# Patient Record
Sex: Female | Born: 1970 | Hispanic: No | Marital: Single | Smoking: Never smoker
Health system: Southern US, Community
[De-identification: ages and names within clinical notes are randomized; demographics above are authoritative.]

---

## 2010-05-27 ENCOUNTER — Emergency Department (HOSPITAL_COMMUNITY)
Admission: EM | Admit: 2010-05-27 | Discharge: 2010-05-27 | Disposition: A | Discharge status: Other Acute Inpatient Hospital | Payer: Medicaid Other | Source: Home / Self Care | Attending: Emergency Medicine | Admitting: Emergency Medicine

## 2010-05-27 ENCOUNTER — Emergency Department (HOSPITAL_COMMUNITY): Payer: Medicaid Other

## 2010-05-27 ENCOUNTER — Inpatient Hospital Stay (HOSPITAL_COMMUNITY)
Admission: AD | Admit: 2010-05-27 | Discharge: 2010-06-01 | DRG: 749 | Disposition: A | Discharge status: Home / Self Care | Payer: Medicaid Other | Source: Ambulatory Visit | Attending: Obstetrics & Gynecology | Admitting: Obstetrics & Gynecology

## 2010-05-27 DIAGNOSIS — R5383 Other fatigue: Secondary | ICD-10-CM | POA: Insufficient documentation

## 2010-05-27 DIAGNOSIS — R5381 Other malaise: Secondary | ICD-10-CM | POA: Insufficient documentation

## 2010-05-27 DIAGNOSIS — R109 Unspecified abdominal pain: Secondary | ICD-10-CM | POA: Diagnosis present

## 2010-05-27 DIAGNOSIS — D649 Anemia, unspecified: Secondary | ICD-10-CM | POA: Diagnosis present

## 2010-05-27 DIAGNOSIS — R51 Headache: Secondary | ICD-10-CM | POA: Insufficient documentation

## 2010-05-27 DIAGNOSIS — R0609 Other forms of dyspnea: Secondary | ICD-10-CM | POA: Insufficient documentation

## 2010-05-27 DIAGNOSIS — D72829 Elevated white blood cell count, unspecified: Secondary | ICD-10-CM | POA: Insufficient documentation

## 2010-05-27 DIAGNOSIS — N1330 Unspecified hydronephrosis: Secondary | ICD-10-CM | POA: Diagnosis present

## 2010-05-27 DIAGNOSIS — R509 Fever, unspecified: Secondary | ICD-10-CM | POA: Insufficient documentation

## 2010-05-27 DIAGNOSIS — N731 Chronic parametritis and pelvic cellulitis: Principal | ICD-10-CM | POA: Diagnosis present

## 2010-05-27 DIAGNOSIS — L0291 Cutaneous abscess, unspecified: Secondary | ICD-10-CM

## 2010-05-27 DIAGNOSIS — R112 Nausea with vomiting, unspecified: Secondary | ICD-10-CM | POA: Insufficient documentation

## 2010-05-27 DIAGNOSIS — R319 Hematuria, unspecified: Secondary | ICD-10-CM | POA: Insufficient documentation

## 2010-05-27 DIAGNOSIS — IMO0002 Reserved for concepts with insufficient information to code with codable children: Secondary | ICD-10-CM

## 2010-05-27 DIAGNOSIS — R0989 Other specified symptoms and signs involving the circulatory and respiratory systems: Secondary | ICD-10-CM | POA: Insufficient documentation

## 2010-05-27 DIAGNOSIS — N7093 Salpingitis and oophoritis, unspecified: Secondary | ICD-10-CM | POA: Insufficient documentation

## 2010-05-27 DIAGNOSIS — R42 Dizziness and giddiness: Secondary | ICD-10-CM | POA: Insufficient documentation

## 2010-05-27 LAB — URINALYSIS, ROUTINE W REFLEX MICROSCOPIC
Bilirubin Urine: NEGATIVE
Glucose, UA: NEGATIVE mg/dL
Ketones, ur: NEGATIVE mg/dL
Protein, ur: 100 mg/dL — AB
pH: 6 (ref 5.0–8.0)

## 2010-05-27 LAB — COMPREHENSIVE METABOLIC PANEL
ALT: 71 U/L — ABNORMAL HIGH (ref 0–35)
AST: 65 U/L — ABNORMAL HIGH (ref 0–37)
CO2: 26 mEq/L (ref 19–32)
Chloride: 96 mEq/L (ref 96–112)
GFR calc Af Amer: >60 mL/min (ref >60)
GFR calc non Af Amer: >60 mL/min (ref >60)
Glucose, Bld: 78 mg/dL (ref 70–99)
Sodium: 132 mEq/L — ABNORMAL LOW (ref 135–145)
Total Bilirubin: 1.5 mg/dL — ABNORMAL HIGH (ref 0.3–1.2)

## 2010-05-27 LAB — URINE MICROSCOPIC-ADD ON

## 2010-05-27 LAB — DIFFERENTIAL
Basophils Relative: 1 % (ref 0–1)
Eosinophils Absolute: 0 10*3/uL (ref 0.0–0.7)
Eosinophils Relative: 0 % (ref 0–5)
Monocytes Absolute: 3.1 10*3/uL — ABNORMAL HIGH (ref 0.1–1.0)
Neutro Abs: 28.7 10*3/uL — ABNORMAL HIGH (ref 1.7–7.7)
Neutrophils Relative %: 84 % — ABNORMAL HIGH (ref 43–77)

## 2010-05-27 LAB — WET PREP, GENITAL
Clue Cells Wet Prep HPF POC: NONE SEEN
Trich, Wet Prep: NONE SEEN

## 2010-05-27 LAB — CBC
HCT: 32.2 % — ABNORMAL LOW (ref 36.0–46.0)
MCH: 28.6 pg (ref 26.0–34.0)
MCV: 79.3 fL (ref 78.0–100.0)
RBC: 4.06 MIL/uL (ref 3.87–5.11)
RDW: 14.9 % (ref 11.5–15.5)
WBC: 34.1 10*3/uL — ABNORMAL HIGH (ref 4.0–10.5)

## 2010-05-27 LAB — LIPASE, BLOOD: Lipase: 103 U/L — ABNORMAL HIGH (ref 11–59)

## 2010-05-27 MED ORDER — IOHEXOL 300 MG/ML  SOLN
100.0000 mL | Freq: Once | INTRAMUSCULAR | Status: AC | PRN
  Administered 2010-05-27: 100 mL via INTRAVENOUS

## 2010-05-28 DIAGNOSIS — R5383 Other fatigue: Secondary | ICD-10-CM

## 2010-05-28 DIAGNOSIS — D72829 Elevated white blood cell count, unspecified: Secondary | ICD-10-CM

## 2010-05-28 DIAGNOSIS — R509 Fever, unspecified: Secondary | ICD-10-CM

## 2010-05-28 DIAGNOSIS — N7093 Salpingitis and oophoritis, unspecified: Secondary | ICD-10-CM

## 2010-05-28 DIAGNOSIS — R5381 Other malaise: Secondary | ICD-10-CM

## 2010-05-28 LAB — DIFFERENTIAL
Basophils Absolute: 0.2 10*3/uL — ABNORMAL HIGH (ref 0.0–0.1)
Basophils Absolute: 0.3 10*3/uL — ABNORMAL HIGH (ref 0.0–0.1)
Basophils Relative: 1 % (ref 0–1)
Eosinophils Absolute: 0.1 10*3/uL (ref 0.0–0.7)
Eosinophils Relative: 0 % (ref 0–5)
Lymphocytes Relative: 5 % — ABNORMAL LOW (ref 12–46)
Lymphs Abs: 1.6 10*3/uL (ref 0.7–4.0)
Monocytes Absolute: 3.3 10*3/uL — ABNORMAL HIGH (ref 0.1–1.0)
Monocytes Relative: 9 % (ref 3–12)
WBC Morphology: INCREASED

## 2010-05-28 LAB — COMPREHENSIVE METABOLIC PANEL
ALT: 60 U/L — ABNORMAL HIGH (ref 0–35)
BUN: 8 mg/dL (ref 6–23)
CO2: 22 mEq/L (ref 19–32)
Calcium: 7.7 mg/dL — ABNORMAL LOW (ref 8.4–10.5)
Calcium: 7.8 mg/dL — ABNORMAL LOW (ref 8.4–10.5)
Creatinine, Ser: 0.93 mg/dL (ref 0.4–1.2)
GFR calc non Af Amer: >60 mL/min (ref >60)
Glucose, Bld: 67 mg/dL — ABNORMAL LOW (ref 70–99)
Glucose, Bld: 72 mg/dL (ref 70–99)
Sodium: 135 mEq/L (ref 135–145)
Total Protein: 5.9 g/dL — ABNORMAL LOW (ref 6.0–8.3)

## 2010-05-28 LAB — CBC
HCT: 26.4 % — ABNORMAL LOW (ref 36.0–46.0)
Hemoglobin: 9.7 g/dL — ABNORMAL LOW (ref 12.0–15.0)
Hemoglobin: 9.2 g/dL — ABNORMAL LOW (ref 12.0–15.0)
MCH: 28.4 pg (ref 26.0–34.0)
MCHC: 35.8 g/dL (ref 30.0–36.0)
Platelets: 378 10*3/uL (ref 150–400)
RDW: 15 % (ref 11.5–15.5)
RDW: 15 % (ref 11.5–15.5)
WBC: 32.7 10*3/uL — ABNORMAL HIGH (ref 4.0–10.5)

## 2010-05-28 LAB — HEPATITIS PANEL, ACUTE
Hep B C IgM: NEGATIVE
Hepatitis B Surface Ag: NEGATIVE

## 2010-05-28 LAB — RAPID HIV SCREEN (WH-MAU): Rapid HIV Screen: NONREACTIVE

## 2010-05-28 LAB — ABO/RH: ABO/RH(D): A POS

## 2010-05-29 LAB — COMPREHENSIVE METABOLIC PANEL
ALT: 50 U/L — ABNORMAL HIGH (ref 0–35)
AST: 36 U/L (ref 0–37)
Albumin: 1.7 g/dL — ABNORMAL LOW (ref 3.5–5.2)
Alkaline Phosphatase: 93 U/L (ref 39–117)
BUN: 8 mg/dL (ref 6–23)
Chloride: 110 mEq/L (ref 96–112)
Potassium: 3.5 mEq/L (ref 3.5–5.1)
Sodium: 134 mEq/L — ABNORMAL LOW (ref 135–145)
Total Bilirubin: 1.4 mg/dL — ABNORMAL HIGH (ref 0.3–1.2)

## 2010-05-29 LAB — URINE CULTURE: Special Requests: POSITIVE

## 2010-05-29 LAB — CBC
MCV: 81.1 fL (ref 78.0–100.0)
Platelets: 352 10*3/uL (ref 150–400)
RBC: 2.96 MIL/uL — ABNORMAL LOW (ref 3.87–5.11)
WBC: 24.4 10*3/uL — ABNORMAL HIGH (ref 4.0–10.5)

## 2010-05-30 ENCOUNTER — Ambulatory Visit (HOSPITAL_COMMUNITY): Payer: Medicaid Other | Attending: Obstetrics & Gynecology

## 2010-05-30 ENCOUNTER — Ambulatory Visit (HOSPITAL_COMMUNITY): Payer: Self-pay

## 2010-05-30 DIAGNOSIS — N731 Chronic parametritis and pelvic cellulitis: Secondary | ICD-10-CM | POA: Insufficient documentation

## 2010-05-30 LAB — CBC
HCT: 23.7 % — ABNORMAL LOW (ref 36.0–46.0)
Hemoglobin: 8.1 g/dL — ABNORMAL LOW (ref 12.0–15.0)
MCHC: 34.2 g/dL (ref 30.0–36.0)
RBC: 2.93 MIL/uL — ABNORMAL LOW (ref 3.87–5.11)
WBC: 21.5 10*3/uL — ABNORMAL HIGH (ref 4.0–10.5)

## 2010-05-30 LAB — COMPREHENSIVE METABOLIC PANEL
ALT: 39 U/L — ABNORMAL HIGH (ref 0–35)
AST: 25 U/L (ref 0–37)
Alkaline Phosphatase: 106 U/L (ref 39–117)
CO2: 20 mEq/L (ref 19–32)
Calcium: 7.8 mg/dL — ABNORMAL LOW (ref 8.4–10.5)
Chloride: 115 mEq/L — ABNORMAL HIGH (ref 96–112)
GFR calc Af Amer: >60 mL/min (ref >60)
GFR calc non Af Amer: >60 mL/min (ref >60)
Glucose, Bld: 62 mg/dL — ABNORMAL LOW (ref 70–99)
Potassium: 4.2 mEq/L (ref 3.5–5.1)
Sodium: 139 mEq/L (ref 135–145)
Total Bilirubin: 0.8 mg/dL (ref 0.3–1.2)

## 2010-05-30 LAB — AMYLASE: Amylase: 116 U/L — ABNORMAL HIGH (ref 0–105)

## 2010-05-31 ENCOUNTER — Inpatient Hospital Stay (HOSPITAL_COMMUNITY): Payer: Medicaid Other

## 2010-05-31 LAB — CBC
MCH: 27.7 pg (ref 26.0–34.0)
MCHC: 34.6 g/dL (ref 30.0–36.0)
MCV: 80.1 fL (ref 78.0–100.0)
Platelets: 471 10*3/uL — ABNORMAL HIGH (ref 150–400)
RBC: 2.67 MIL/uL — ABNORMAL LOW (ref 3.87–5.11)

## 2010-05-31 LAB — GC/CHLAMYDIA PROBE AMP, GENITAL
Chlamydia, DNA Probe: NEGATIVE
GC Probe Amp, Genital: NEGATIVE

## 2010-06-01 LAB — COMPREHENSIVE METABOLIC PANEL
ALT: 25 U/L (ref 0–35)
AST: 16 U/L (ref 0–37)
CO2: 24 mEq/L (ref 19–32)
Chloride: 110 mEq/L (ref 96–112)
GFR calc Af Amer: >60 mL/min (ref >60)
GFR calc non Af Amer: >60 mL/min (ref >60)
Glucose, Bld: 75 mg/dL (ref 70–99)
Sodium: 139 mEq/L (ref 135–145)
Total Bilirubin: 0.8 mg/dL (ref 0.3–1.2)

## 2010-06-01 LAB — TYPE AND SCREEN
ABO/RH(D): A POS
Unit division: 0

## 2010-06-01 LAB — CBC
MCHC: 36.3 g/dL — ABNORMAL HIGH (ref 30.0–36.0)
Platelets: 452 10*3/uL — ABNORMAL HIGH (ref 150–400)
RDW: 15.1 % (ref 11.5–15.5)
WBC: 17.2 10*3/uL — ABNORMAL HIGH (ref 4.0–10.5)

## 2010-06-03 LAB — CULTURE, BLOOD (ROUTINE X 2)
Culture  Setup Time: 201203181159
Culture: NO GROWTH

## 2010-06-03 LAB — CULTURE, ROUTINE-ABSCESS

## 2010-06-04 LAB — ANAEROBIC CULTURE

## 2010-06-08 ENCOUNTER — Ambulatory Visit (HOSPITAL_COMMUNITY)
Admit: 2010-06-08 | Discharge: 2010-06-08 | Disposition: A | Discharge status: Home / Self Care | Payer: Medicaid Other | Attending: Obstetrics and Gynecology | Admitting: Obstetrics and Gynecology

## 2010-06-08 ENCOUNTER — Ambulatory Visit (HOSPITAL_COMMUNITY)
Admission: RE | Admit: 2010-06-08 | Discharge: 2010-06-08 | Disposition: A | Discharge status: Home / Self Care | Payer: Medicaid Other | Source: Ambulatory Visit | Attending: Obstetrics and Gynecology | Admitting: Obstetrics and Gynecology

## 2010-06-08 ENCOUNTER — Other Ambulatory Visit: Payer: Self-pay | Admitting: Obstetrics and Gynecology

## 2010-06-08 DIAGNOSIS — N949 Unspecified condition associated with female genital organs and menstrual cycle: Secondary | ICD-10-CM | POA: Insufficient documentation

## 2010-06-08 DIAGNOSIS — K37 Unspecified appendicitis: Secondary | ICD-10-CM | POA: Insufficient documentation

## 2010-06-08 DIAGNOSIS — IMO0002 Reserved for concepts with insufficient information to code with codable children: Secondary | ICD-10-CM

## 2010-06-08 DIAGNOSIS — R599 Enlarged lymph nodes, unspecified: Secondary | ICD-10-CM | POA: Insufficient documentation

## 2010-06-08 MED ORDER — IOHEXOL 300 MG/ML  SOLN
100.0000 mL | Freq: Once | INTRAMUSCULAR | Status: AC | PRN
  Administered 2010-06-08: 100 mL via INTRAVENOUS

## 2010-06-09 ENCOUNTER — Ambulatory Visit: Payer: Self-pay | Admitting: Obstetrics & Gynecology

## 2010-06-09 DIAGNOSIS — K37 Unspecified appendicitis: Secondary | ICD-10-CM

## 2010-06-10 NOTE — Progress Notes (Signed)
[name], [name]        ACCOUNT NO.:  [id number]  MEDICAL RECORD NO.:  [id number]           PATIENT TYPE:  A  LOCATION:  [facility]                   FACILITY:  [facility]  PHYSICIAN:  Scheryl Darter, MD       DATE OF BIRTH:  04-21-1970  DATE OF SERVICE:                                 CLINIC NOTE  The patient comes here in followup after being hospitalized from March 17 to March 22 with a pelvic abscess.  A drain was placed by Interventional Radiology during hospitalization and she has been discharge since the 22nd.  She has been receiving IV.  Pincus Sanes which was to continue for 14 days after discharge.  She says she still has a decreased appetite and some pain, but no fever.  CT scan was performed at [facility] yesterday and the report states that there is a resolution of large a pelvic abscess, but appendicitis was now visualized which was consistently with the source of the prior pelvic abscess.  The drain was removed.  The drainage site appears well healing.  Her abdomen is mildly distended and minimally tender.  No guarding or rebound.  I spoke to the PA for [facility] at [facility].  Arrangements will be made for her to be seen by Dr. Freida Busman early next week with plans to schedule an antral appendectomy.  She will continue with antibiotics.  Contact information for the patient was exchanged.  If she has increasing symptoms, she should report was [facility].     Scheryl Darter, MD    JA/MEDQ  D:  06/09/2010  T:  06/10/2010  Job:  [id number]

## 2010-06-14 ENCOUNTER — Ambulatory Visit: Payer: Medicaid Other

## 2010-06-19 ENCOUNTER — Inpatient Hospital Stay (HOSPITAL_COMMUNITY)
Admission: AD | Admit: 2010-06-19 | Discharge: 2010-06-19 | Disposition: A | Discharge status: Home / Self Care | Payer: Medicaid Other | Source: Ambulatory Visit | Attending: Family Medicine | Admitting: Family Medicine

## 2010-06-19 ENCOUNTER — Emergency Department (HOSPITAL_COMMUNITY)
Admission: EM | Admit: 2010-06-19 | Discharge: 2010-06-19 | Disposition: A | Discharge status: Home / Self Care | Payer: Medicaid Other | Attending: Emergency Medicine | Admitting: Emergency Medicine

## 2010-06-19 DIAGNOSIS — Z139 Encounter for screening, unspecified: Secondary | ICD-10-CM | POA: Insufficient documentation

## 2010-06-19 DIAGNOSIS — Z452 Encounter for adjustment and management of vascular access device: Secondary | ICD-10-CM | POA: Insufficient documentation

## 2010-06-19 DIAGNOSIS — Z8613 Personal history of malaria: Secondary | ICD-10-CM | POA: Insufficient documentation

## 2010-06-19 DIAGNOSIS — N731 Chronic parametritis and pelvic cellulitis: Secondary | ICD-10-CM | POA: Insufficient documentation

## 2010-06-20 NOTE — Discharge Summary (Signed)
  [name], [name]        ACCOUNT NO.:  [id number]  MEDICAL RECORD NO.:  [id number]           PATIENT TYPE:  I  LOCATION:  9307                          [facility]  [facility]  PHYSICIAN:  Catalina Antigua, MD     DATE OF BIRTH:  [birth year]  DATE OF ADMISSION:  05/27/2010 DATE OF DISCHARGE:  06/01/2010                              DISCHARGE SUMMARY   ADMISSION DIAGNOSIS:  Abdominal pain.  DISCHARGE DIAGNOSIS:  Pelvic abscess.  BRIEF HISTORY:  This is a 40 year old G1, P1 who presented to [facility] Emergency Room with a 5-day history of diffuse abdominal pain and diarrhea.  On admission, she had a significant leukocytosis of 34,000, elevated AST and ALT of 65 and 71 as well as elevated amylase of 163 and lipase of 103.  Physical exam demonstrated abdominal distention.  CAT scan was performed and demonstrated 2 large pelvic abscesses suspicious for tubo-ovarian abscess.  The patient was transferred to [facility] and was started on a course of IV antibiotics.  The patient remained afebrile throughout her stay.  General Surgery was consulted and helped coordinate for higher drainage of the pelvic abscess.  On hospital day #4, the patient was transferred to [facility] for drainage of the pelvic abscess where approximately 420 mL of purulent discharge was removed and Jackson-Pratt suction bulb remained in place for continuous drainage.  The patient tolerated the procedure well.  Her white count over the course of admission continued to decrease.  She was found to be anemic with a hemoglobin of 7.4 on hospital day #5, which was thought to be dilutional effect, but she received 3 units of packed red blood cells.  On day of discharge, her white count was found to be 17,000, hemoglobin of 12.5.  Her amylase and lipase were within the normal range as well as her LFTs.  The patient remained afebrile throughout her course of admission and her vital signs remained otherwise stable.   On hospital day #6, the patient was seem to be stable for discharge home.  She also had a PICC line placed on hospital day #5 and the patient was discharged home with plans for home health.  Administration of Invanz for the next 14 days.  Home Health is also to help manage the dressing and flush the JP drain.  The patient has a followup appointment scheduled at [facility] on June 08, 2010, at 10 a.m. for repeat CAT scan of the abdomen and possible drain removal if the abscess has significantly decreased in size.  She also has a follow up GYN appointment scheduled on June 09, 2010, at 11 a.m.  The patient was instructed to return to the emergency room if she develops fevers, severe abdominal pain, or signs of infection surrounding the drain.  The patient is to take Tylenol or Motrin p.r.n. for pain.  The patient verbalized understanding of these instructions, which were provided by me in [place].  All questions were answered.    Catalina Antigua, MD    PC/MEDQ  D:  06/01/2010  T:  06/02/2010  Job:  [id number]  Electronically Signed by Catalina Antigua  on 06/20/2010 10:38:51 AM

## 2010-06-21 NOTE — Consult Note (Signed)
[name], [name]        ACCOUNT NO.:  [id number]  MEDICAL RECORD NO.:  [id number]           PATIENT TYPE:  I  LOCATION:  9307                          [facility]  PHYSICIAN:  Lennie Muckle, MD      DATE OF BIRTH:  04-20-1970  DATE OF CONSULTATION:  05/29/2010 DATE OF DISCHARGE:                                CONSULTATION   REFERRING PHYSICIAN:  Dr. Jolayne Panther, Teaching Service.  REASON FOR CONSULTATION:  Abdominal pain with large abdominal abscess.  BRIEF HISTORY:  The patient is a 40 year old [place] American female, recent immigrant from [place] in [place] who was admitted on May 27, 2010, with a 3- to 5-days history of abdominal pain prior to admission.  The patient noticed the pain comes and goes, it covers her abdomen, most significantly in  the right lower and left lower abdomen extending to her back.  It comes and goes.  She does not have any nausea or vomiting.  She has had some fever.  She also developed constipation about 7 days ago with the onset of this problem.  She reports no problems prior to this.  She has recently arrived in the [place] on February 18 and says she was in perfectly good health prior.   PAST MEDICAL HISTORY:  H and P from the hospitalist service suggests she has had malaria but she reports currently that she has never had malaria.  Of note, the patient speaks only [place] and thus her interview was done over the phone using interpreter service.  PAST SURGICAL HISTORY:  She had a full-term pregnancy with a C-section.  FAMILY HISTORY:  I can only ascertain that both her parents are deceased.  Her siblings are in good health.  SOCIAL HISTORY:  She denies alcohol, tobacco or drug use.  She is not married.  She currently lives with her sister.  She has been to country since February [place].  REVIEW OF SYSTEMS:  Last menstrual period was April 08, 2010, her weight is down but she is not sure how much.  She has dizziness, sometimes  with the abdominal pain.  She is unaware of any skin changes or rashes.  PULMONARY:  She denies any problems with shortness of breath, I am not certain, but I do not think she has not had any problems with upper respiratory tract infections, asthma or wheezing. CARDIAC:  She has chest discomfort only when her belly hurts.  GI: Negative for nausea, vomiting, diarrhea.  She has had some constipation. No bowel movement apparently for the last few days.  She is unaware of any blood in her stool.  GU:  She has had trouble voiding and pain with voiding since the onset of her abdominal pain.  MUSCULOSKELETAL:  No changes.  She does note the abdominal pain is similar to what she have when she have dysentery.  PSYCH:  No changes.  MEDICATIONS ON ADMISSION:  None.  Current hospital meds include Colace 1 b.i.d., doxycycline 100 mg q.12, Zosyn 3.375 g q.8.  She is on a prenatal vitamin.  She had a PPD placed.  She has Motrin, Tylenol and Percocet p.r.n. for pain.  ALLERGIES:  None.  PHYSICAL EXAMINATION:  GENERAL:  This is a thin African female in no acute distress. VITAL SIGNS:  Her T-max yesterday was 100.3, currently she is 98.7, blood pressure is 121/86, last heart rate was 96, temperature 97.8.  She is 100% on room air.  She weighs 54.4 kg. HEENT:  Head, normocephalic.  Eyes, ears, nose and throat are all within normal limits.  Dentition is in good repair. NECK:  No bruits.  No JVD.  No thyromegaly.  Trachea is in the midline. PULMONARY:  Chest is nontender.  Clear to auscultation. CARDIAC:  Normal S1 and S2, slightly tachycardic.  Pulses are +2 in the upper extremities, somewhat diminished in the lower extremities. ABDOMEN:  Bowel sounds are present.  Abdomen is distended.  She is diffusely tender.  It is mostly in the right and left lower quadrants. LYMPHS:  No lymphadenopathy was palpated. MUSCULOSKELETAL:  Normal joints.  Normal range of motion and strength. SKIN:  No changes  noted. NEUROLOGIC:  Cranial nerves were grossly intact. PSYCH:  Normal affect.  LABORATORY FINDINGS:  On admission, urine pregnancy was negative.  UA was positive for blood.  There were 3-6 white cells and 11-20 red cells. Wet prep was negative.  HIV was negative.  Amylase was 163.  Lipase was 103.  Protime was 14.8.  INR was 1.14.  Sodium is 132, potassium is 2.7, chloride is 96, CO2 is 26, BUN is 10, creatinine is 0.74, glucose 78, total bilirubin elevated at 1.5, alk phos 137, SGOT and SGPT slightly elevated at 65 and 71 respectively.  White count was 34,000.  Currently white count is 24.4, hemoglobin is 8, hematocrit is 24, platelets are 352,000, sodium is 134, potassium is 3.5, chloride is 110, CO2 is 20, BUN is 8, creatinine is 0.98, total bilirubin is 1.4, alk phos 93, SGPT is down to 36 normal, SGOT is slightly elevated down to 50.  CT of the abdomen showed a multiloculated fluid collection in lower abdomen and pelvis compatible with an abscess, largest collection is in the cul-de- sac between the uterus and rectum, it measures 11.6 cm.  This most likely represents a large tuboovarian abscess but they could not rule out involving the appendix.  There is some collection extends into the retroperitoneum along the ileocolic ligament.  There is an enlarged pelvic lymph nodes which are likely reactive and there is mild bilateral hydronephrosis secondary to obstruction of the ureters by abscess collection.  IMPRESSION: 1. Multiloculated fluid collection in cul-de-sac between uterus and     rectum of unknown etiology. 2. Possible tuboovarian abscess versus lower gastrointestinal source     for abscess. 3. Hydronephrosis. 4. Anemia. 5. Leukocytosis.  PLAN:  Discussed  with Dr. Freida Busman.   After reviewing patient and study it was her opinion that the collection was not related to a lower GI source.  We will ask IR to reevaluate for possible drainage with further workup and evaluation  as needed.    Eber Hong, P.A.   ______________________________ Lennie Muckle, MD   WDJ/MEDQ  D:  05/29/2010  T:  05/29/2010  Job:  [id number]  cc:   Dr. Jolayne Panther  Electronically Signed by Sherrie George P.A. on 06/10/2010 10:16:44 AM Electronically Signed by Bertram Savin MD on 06/21/2010 12:08:06 PM

## 2010-06-26 ENCOUNTER — Other Ambulatory Visit: Payer: Self-pay | Admitting: General Surgery

## 2010-06-26 DIAGNOSIS — IMO0002 Reserved for concepts with insufficient information to code with codable children: Secondary | ICD-10-CM

## 2010-07-07 NOTE — H&P (Signed)
[name], [name]        ACCOUNT NO.:  [id number]  MEDICAL RECORD NO.:  [id number]           PATIENT TYPE:  I  LOCATION:  9303                          [facility]  PHYSICIAN:  Nandana Krolikowski H. Tenny Craw, MD     DATE OF BIRTH:  [birth year]  DATE OF ADMISSION:  05/27/2010 DATE OF DISCHARGE:                             HISTORY & PHYSICAL   CHIEF COMPLAINT:  Abdominal pain.  HISTORY OF PRESENT ILLNESS:  Ms. [name] is a 40 year old gravida 1, para 1 African female who was in her usual state of health until approximately 3-5 days prior to admission when she began to develop vague abdominal pain and disease.  The patient is recent immigrant to the [place] from the [place].  She speaks only [place].  The interview with the patient was conducted with the help of the interpreter line.  The patient states that over the last several days, she has had worsening abdominal and back pain.  She presented to [facility] ER.  White count was obtained and she had a significant leukocytosis of 34,000.  She also had abdominal distention and a CT scan was performed which demonstrated 2 large abscess is in the pelvis one in the front of the uterus and one in the back of the uterus.  The ovaries were not well visualized and the appendix was not well visualized; however, the radiologist called the abscess is consistent with tubo- ovarian abscesses, and the patient was requested to be transferred to [facility] for management of TOA by the emergency room physician.  The patient denies any fevers, chills, recent nausea, or vomiting.  She does endorse about a 5-pound weight loss recently.  She denies diarrhea or constipation.  She denies night sweats.  She denies any sick contacts.  PAST MEDICAL HISTORY:  Significant for malaria.  PAST SURGICAL HISTORY:  Significant for a full-term primary cesarean section for a female infant that weighed 10 pounds 12 ounces.  This was in 2008 in her  native [place].  She denies any gestational diabetes and does endorse receiving routine prenatal care.  She denies any other surgical procedures.  She is not currently sexually active and has not had intercourse since immigrating to the [place].  SOCIAL HISTORY:  Negative x3.  PHYSICAL EXAMINATION:  VITAL SIGNS:  Blood pressure is 122/90, heart rate 104, temperature is 99.6.  Respirations are 20. GENERAL:  She is alert and oriented x3.  She is slightly ill appearing. She does seem somewhat dehydrated appearing and almost has a look of temporal wasting. ABDOMEN:  Soft but distended appearing tender but no rebound or guarding.  Per the ER exam, she did have cervical motion tenderness on exam.  LABORATORY DATA:  CBC:  White blood cell count 4.1, hemoglobin 11.6, hematocrit 32.2, platelets clumps are noted on the smear.  However, the count appears adequate, 84% neutrophils.  The white blood cell morphology is greater than 20% bands with mild left shift, target cells are also noted.  CMET:  Sodium is 132, potassium is 2.7, chloride is 96, CO2 is 26, glucose 78, BUN is 10, creatinine 0.74, AST is 65, ALT is 71.  Lipase is 103, INR 1.14.  PT 14.8.  Wet prep, many white blood cells are seen.  Per the ER physician, gonorrhea and Chlamydia were collected and pending.  Urinalysis showed large blood, 1+ protein, and small amount of leukocyte esterase, negative nitrites, micro showed 3 to 6 white blood cells, 11-20 red blood cells, rare bacteria.  Pregnancy test is negative.  ASSESSMENT AND PLAN:  This is a 40 year old African female with pelvic abscess and leukocytosis.  The patient's care is being transferred to the teaching service.  Dr. Nicholaus Bloom accepting the patient and transfer. Leukocytosis:  The patient did receive cefoxitin and doxy in the ER.  We will change the cefoxitin to Zosyn and continue the doxycycline, repeat a CBC with diff, and a CMET.  We will obtain blood culture, urine cultures,  rapid HIV, RPR, hepatitis panel.  We will place a PPD for tuberculosis evaluation.     [name]. Tenny Craw, MD     KHR/MEDQ  D:  05/28/2010  T:  05/28/2010  Job:  [id number]  Electronically Signed by Waynard Reeds MD on 07/07/2010 10:45:54 AM

## 2010-07-11 ENCOUNTER — Ambulatory Visit
Admission: RE | Admit: 2010-07-11 | Discharge: 2010-07-11 | Disposition: A | Discharge status: Home / Self Care | Payer: Medicaid Other | Source: Ambulatory Visit | Attending: General Surgery | Admitting: General Surgery

## 2010-07-11 DIAGNOSIS — IMO0002 Reserved for concepts with insufficient information to code with codable children: Secondary | ICD-10-CM

## 2010-07-11 MED ORDER — IOHEXOL 300 MG/ML  SOLN
100.0000 mL | Freq: Once | INTRAMUSCULAR | Status: AC | PRN
  Administered 2010-07-11: 100 mL via INTRAVENOUS

## 2010-07-20 ENCOUNTER — Other Ambulatory Visit: Payer: Self-pay | Admitting: General Surgery

## 2010-07-20 ENCOUNTER — Encounter (HOSPITAL_COMMUNITY): Payer: Medicaid Other

## 2010-07-20 ENCOUNTER — Other Ambulatory Visit: Payer: Self-pay | Admitting: Anesthesiology

## 2010-07-20 LAB — DIFFERENTIAL
Basophils Absolute: 0.1 10*3/uL (ref 0.0–0.1)
Basophils Relative: 1 % (ref 0–1)
Eosinophils Absolute: 0.3 10*3/uL (ref 0.0–0.7)
Neutrophils Relative %: 45 % (ref 43–77)

## 2010-07-20 LAB — SURGICAL PCR SCREEN
MRSA, PCR: NEGATIVE
Staphylococcus aureus: POSITIVE — AB

## 2010-07-20 LAB — CBC
MCH: 28.1 pg (ref 26.0–34.0)
Platelets: 257 10*3/uL (ref 150–400)
RBC: 5.05 MIL/uL (ref 3.87–5.11)
WBC: 7 10*3/uL (ref 4.0–10.5)

## 2010-07-20 LAB — HCG, SERUM, QUALITATIVE: Preg, Serum: NEGATIVE

## 2010-07-25 ENCOUNTER — Ambulatory Visit (HOSPITAL_COMMUNITY)
Admission: RE | Admit: 2010-07-25 | Discharge: 2010-07-25 | Disposition: A | Discharge status: Home / Self Care | Payer: Medicaid Other | Source: Ambulatory Visit | Attending: General Surgery | Admitting: General Surgery

## 2010-07-25 ENCOUNTER — Other Ambulatory Visit (INDEPENDENT_AMBULATORY_CARE_PROVIDER_SITE_OTHER): Payer: Self-pay | Admitting: General Surgery

## 2010-07-25 DIAGNOSIS — K35209 Acute appendicitis with generalized peritonitis, without abscess, unspecified as to perforation: Secondary | ICD-10-CM | POA: Insufficient documentation

## 2010-07-25 DIAGNOSIS — Z01812 Encounter for preprocedural laboratory examination: Secondary | ICD-10-CM | POA: Insufficient documentation

## 2010-07-25 DIAGNOSIS — K352 Acute appendicitis with generalized peritonitis, without abscess: Secondary | ICD-10-CM | POA: Insufficient documentation

## 2010-08-28 NOTE — Op Note (Signed)
NAMEPORSHA, SKILTON        ACCOUNT NO.:  [id number]  MEDICAL RECORD NO.:  [id number]           PATIENT TYPE:  O  LOCATION:  DAYL                         FACILITY:  [facility]  PHYSICIAN:  Lennie Muckle, MD      DATE OF BIRTH:  [birth year]  DATE OF PROCEDURE:  07/25/2010 DATE OF DISCHARGE:                              OPERATIVE REPORT   PREOPERATIVE DIAGNOSIS:  History of perforated appendicitis.  POSTOPERATIVE DIAGNOSIS:  History of perforated appendicitis.  PROCEDURE:  Laparoscopic appendectomy.  SURGEON:  Lennie Muckle, MD  ANESTHESIA:  General endotracheal anesthesia.  FINDINGS:  Appendiceal stump, no acute purulent fluid.  SPECIMEN:  Was appendix.  ESTIMATED BLOOD LOSS:  Minimal amount of blood loss.  INDICATIONS FOR PROCEDURE:  Ms. Schillo is a 40 year old female who came in March and had a large pelvic abscess, it was believed to be a tubo-ovarian abscess; however, on followup imaging it seemed more consistent from the appendix.  She was treated with a drain and treated as a perforated appendicitis.  She was on antibiotics for approximately 2 weeks' time.  She had her drain removed without incident and I had seen her back in the office and discussed laparoscopic interval appendectomy.  Risks of surgery and benefits were explained to the patient.  This is via an interpreter and her husband.  She agreed to go ahead and proceed with laparoscopic appendectomy.  DETAILS OF PROCEDURE:  Ms. Sutter was identified in the preoperative holding area, given IV antibiotics, seen by Anesthesia and taken to the operating suite.  Once in the operating room, she was placed in supine position.  Sequential compression devices were applied to her lower extremities.  She was placed under general endotracheal anesthesia.  Her abdomen was prepped and draped in usual sterile fashion.  Surgical time- out performed.  I began by placing a 5-mm trocar in the right upper quadrant  under visualization of camera.  Using Optiview, all layers of abdominal wall were visualized upon entry.  I insufflated the abdomen and found no evidence of injury upon placement of trocar.  I placed a 5- mm trocar at the umbilical region under visualization with camera.  The cecum was identified in the right side of the abdomen.  I upsized the right upper quadrant trocar to a 12-mm trocar and placed a 5-mm trocar in the left side of the abdomen under visualization with camera.  Using blunt graspers, I grasped the cecum and pulled this laterally.  The patient was placed in the right side up Trendelenburg position.  There seem to be a small appendiceal stump which I continued dissecting mostly using blunt dissection.  I mobilized the cecum along the line of Toldt. Once I had mobilized enough of the cecum, I continued dissecting towards the base of the appendix.  We came across the base of the appendix with the Harmonic scalpel.  I was able to visualize the entry point of the ileum into the cecum.  I gained as much length as I could on the stump and placed a laparoscopic stapler across the base of the appendix incorporating a portion of the cecum.  I was just at  the ileocecal valve.  The specimen was placed in an EndoCatch bag and removed from the abdomen.  I irrigated the abdomen with 2 L of saline.  There was minimal amount of oozing from the lateral dissection.  I found no acute vessel.  I placed a layer to fill in this vicinity and over the staple line.  I then closed the fascial defect at the right upper quadrant using a 0 Vicryl suture.  I released pneumoperitoneum, removed the trocars, placed 30 mL of 0.25% Marcaine for local anesthetic.  The patient was extubated, transferred to postanesthesia care unit in stable condition.  She will be discharged home with Percocet and follow up with me in 2 to 3 weeks' time.     Lennie Muckle, MD     ALA/MEDQ  D:  07/25/2010  T:   07/25/2010  Job:  [id number]  Electronically Signed by Bertram Savin MD on 08/28/2010 12:06:13 PM

## 2010-08-30 ENCOUNTER — Ambulatory Visit (INDEPENDENT_AMBULATORY_CARE_PROVIDER_SITE_OTHER): Payer: Medicaid Other | Admitting: Obstetrics and Gynecology

## 2010-08-30 ENCOUNTER — Other Ambulatory Visit: Payer: Self-pay | Admitting: Family Medicine

## 2010-08-30 DIAGNOSIS — Z01419 Encounter for gynecological examination (general) (routine) without abnormal findings: Secondary | ICD-10-CM

## 2010-08-31 NOTE — Group Therapy Note (Signed)
[name], [name]        ACCOUNT NO.:  [id number]  MEDICAL RECORD NO.:  [id number]           PATIENT TYPE:  A  LOCATION:  [facility]                   FACILITY:  [facility]  PHYSICIAN:  Maryelizabeth Kaufmann, MD  DATE OF BIRTH:  October 14, 1970  DATE OF SERVICE:  08/30/2010                                 CLINIC NOTE  CHIEF COMPLAINT:  Annual.  HISTORY OF PRESENT ILLNESS:  This is a 40 year old G3, P1-0-2-1 with a history of previous C-section presenting for well-woman exam.  She denies any acute complaints today.  She does have regular menses.  She denies any vaginal discharge or vaginal odor.  PAST MEDICAL HISTORY:  It is not significant.  PAST SURGICAL HISTORY:  Significant for cesarean section and appendectomy.  GYN HISTORY:  No history of sexually transmitted diseases and no HSV. Possible Pap smear previously done which she states is normal.  OBSTETRICAL HISTORY:  Again the patient is a G3, P1-0-2-1, status post term cesarean delivery for no reasons at this time.  SOCIAL HISTORY:  The patient is married.  No tobacco, alcohol, or drugs.  FAMILY HISTORY:  Negative for breast ovarian or uterine cancer.  MEDICATIONS:  None.  ALLERGIES:  No known drug allergies.  PHYSICAL EXAMINATION:  VITAL SIGNS:  Blood pressure initially 148/94, repeat 133/97, pulse 80, temperature 98.2 with 117.3, height 64-1/4. GENERAL:  The patient is in no acute distress.  Alert and oriented x4. HEENT:  Neck is supple, soft, no lymphadenopathy.  No thyromegaly. CHEST:  Clear to auscultation bilaterally.  No wheezing, rubs, or rhonchi.  Breast exam, soft, nontender, no lymphadenopathy axillary bilaterally, and no masses are palpated. CARDIOVASCULAR:  Regular rate and rhythm with a gallops. ABDOMEN:  Positive bowel sounds, soft, nontender, and nondistended. Previous uterine incision  noted on her lower abdomen. EXTREMITIES:  No clubbing, cyanosis, or edema. GU:  Normal external genitalia.  Normal  vaginal mucosa.  Pink normal physiologic discharge.  No odor.  Cervix is nulliparous in appearance, appeared to be normal.  No lesions were visible.  Pap smear was performed.  Bimanual exam anteverted uterus, mild burning due to previous discomfort at her previous incision otherwise no masses palpated bilaterally.  ASSESSMENT/PLAN: 1. This is a 40 year old gravida 3, para 1-0-2-1 presenting for annual     examination in a well-woman. 2. Well-woman.  Pap was performed.  The patient is to return to clinic     in 1 year or as needed pending on her Pap smear results.          ______________________________ Maryelizabeth Kaufmann, MD    LC/MEDQ  D:  08/30/2010  T:  08/31/2010  Job:  [id number]

## 2010-09-16 ENCOUNTER — Emergency Department (HOSPITAL_COMMUNITY)
Admission: EM | Admit: 2010-09-16 | Discharge: 2010-09-16 | Disposition: A | Discharge status: Home / Self Care | Payer: Medicaid Other | Attending: Emergency Medicine | Admitting: Emergency Medicine

## 2010-09-16 DIAGNOSIS — M79609 Pain in unspecified limb: Secondary | ICD-10-CM | POA: Insufficient documentation

## 2010-09-16 DIAGNOSIS — IMO0002 Reserved for concepts with insufficient information to code with codable children: Secondary | ICD-10-CM | POA: Insufficient documentation

## 2010-09-16 DIAGNOSIS — M7989 Other specified soft tissue disorders: Secondary | ICD-10-CM | POA: Insufficient documentation

## 2010-09-16 DIAGNOSIS — Z8613 Personal history of malaria: Secondary | ICD-10-CM | POA: Insufficient documentation

## 2010-09-19 ENCOUNTER — Emergency Department (HOSPITAL_COMMUNITY)
Admission: EM | Admit: 2010-09-19 | Discharge: 2010-09-19 | Disposition: A | Discharge status: Home / Self Care | Payer: Medicaid Other | Attending: Emergency Medicine | Admitting: Emergency Medicine

## 2010-09-19 DIAGNOSIS — L02519 Cutaneous abscess of unspecified hand: Secondary | ICD-10-CM | POA: Insufficient documentation

## 2010-09-19 DIAGNOSIS — L03019 Cellulitis of unspecified finger: Secondary | ICD-10-CM | POA: Insufficient documentation

## 2010-09-19 DIAGNOSIS — Z4800 Encounter for change or removal of nonsurgical wound dressing: Secondary | ICD-10-CM | POA: Insufficient documentation

## 2010-09-19 DIAGNOSIS — Z8613 Personal history of malaria: Secondary | ICD-10-CM | POA: Insufficient documentation

## 2010-09-19 DIAGNOSIS — M79609 Pain in unspecified limb: Secondary | ICD-10-CM | POA: Insufficient documentation

## 2012-05-04 IMAGING — CT CT ABD-PELV W/ CM
1 of 2 series · 14 of 32 positions shown, 18 images · IV contrast (omnipaque)
Comparison: None.

CLINICAL DATA: Abdominal pain.  Vaginal bleeding.  Nausea.
Hematuria.  Fever.  Elevated white count.  Elevated lipase and
liver enzymes.

CT ABDOMEN AND PELVIS WITH CONTRAST
TECHNIQUE: Multidetector CT imaging of the abdomen and pelvis was
performed following the standard protocol during bolus
administration of intravenous contrast.
Contrast: 100 ml Omnipaque 300

[Series 2: rtn ap with st · axial · 0.72mm/px · z∈[-433,-43]mm · 14 of 89 slices shown, 18 images]
[im 7/89  soft-tissue]
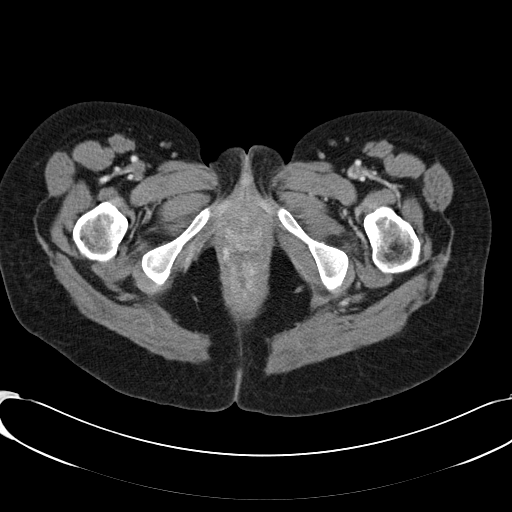
[im 7/89  bone]
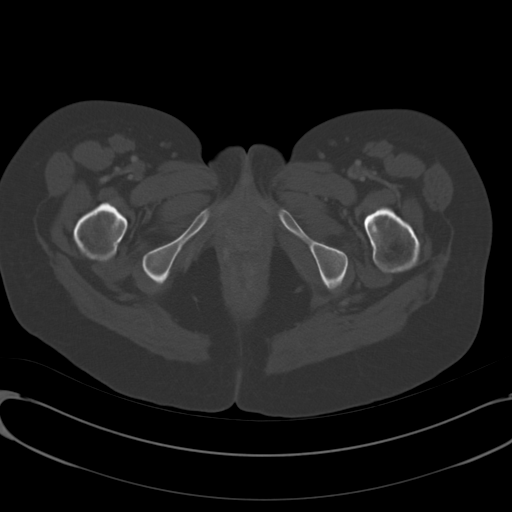
[im 14/89  soft-tissue]
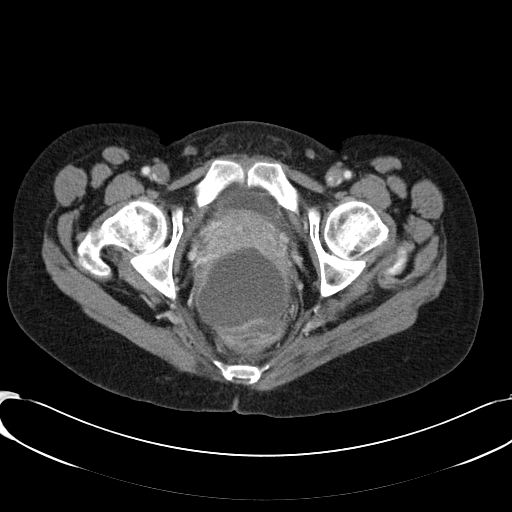
[im 21/89  soft-tissue]
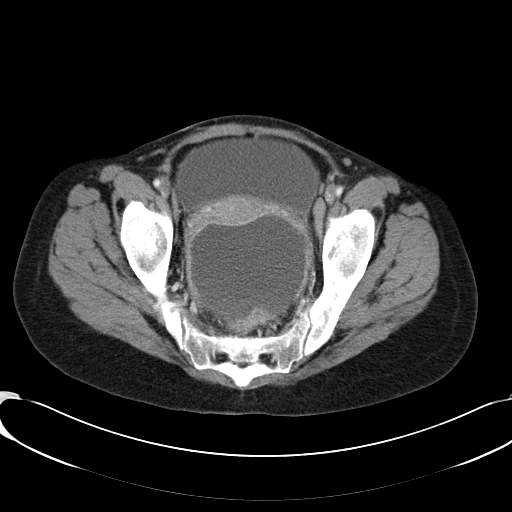
[im 28/89  soft-tissue]
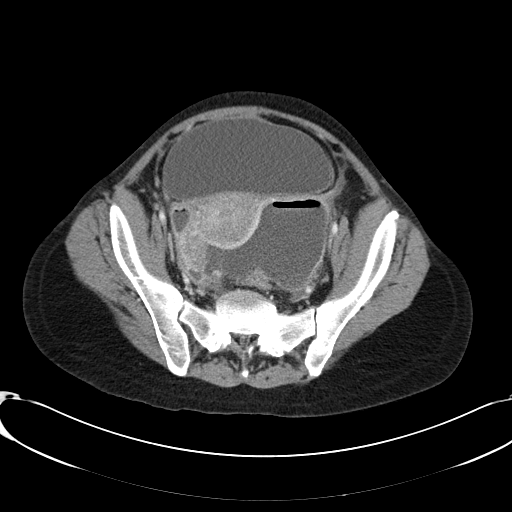
[im 34/89  soft-tissue]
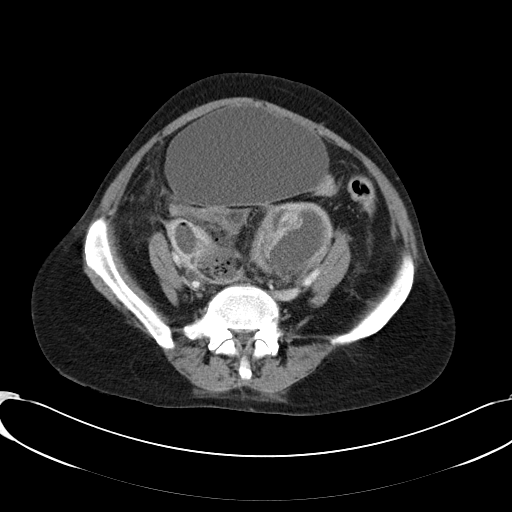
[im 41/89  soft-tissue]
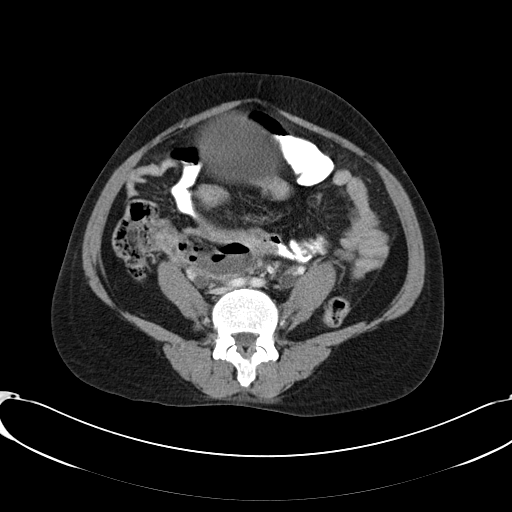
[im 48/89  soft-tissue]
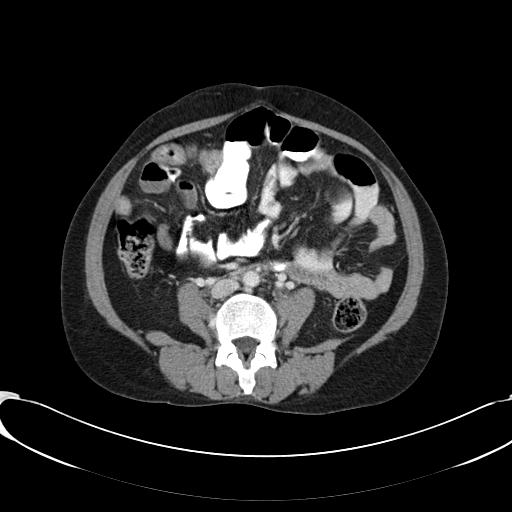
[im 55/89  soft-tissue]
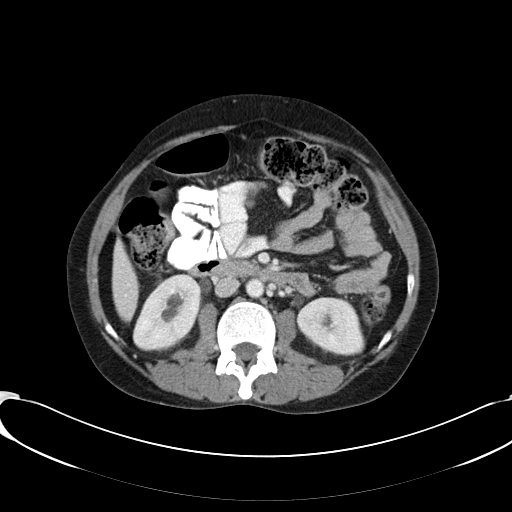
[im 61/89  soft-tissue]
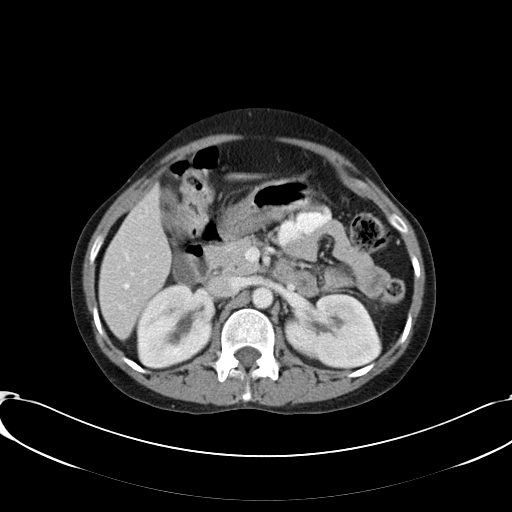
[im 61/89  bone]
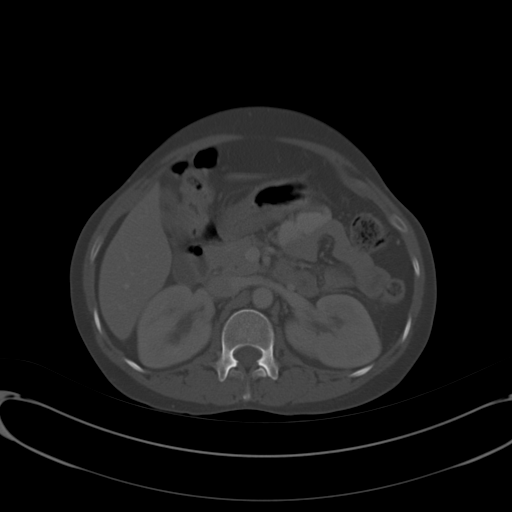
[im 68/89  soft-tissue]
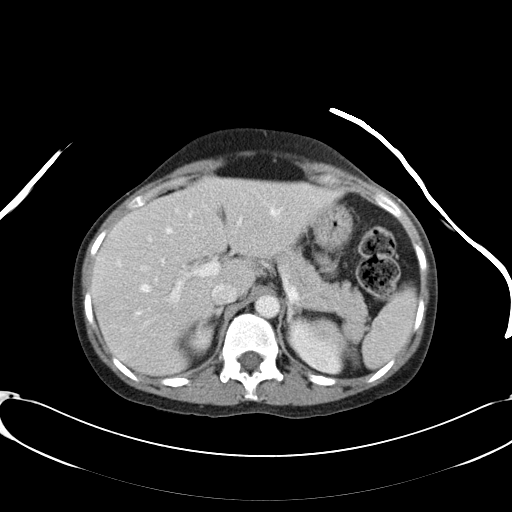
[im 75/89  soft-tissue]
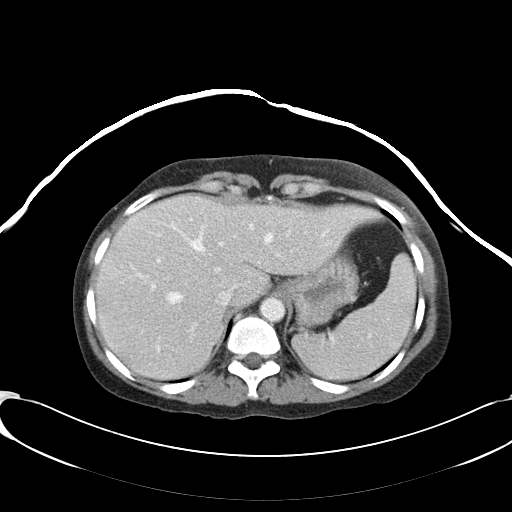
[im 75/89  lung]
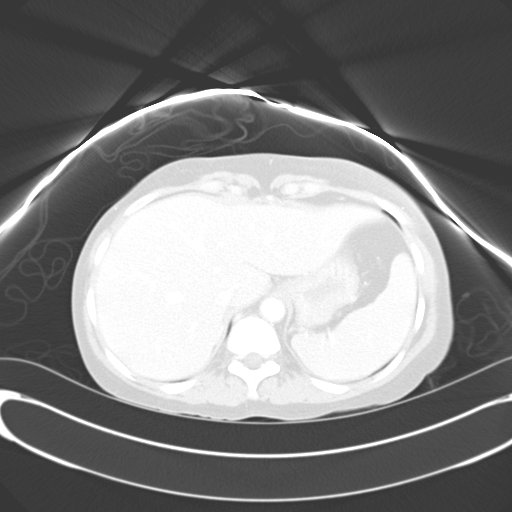
[im 78/89  lung]
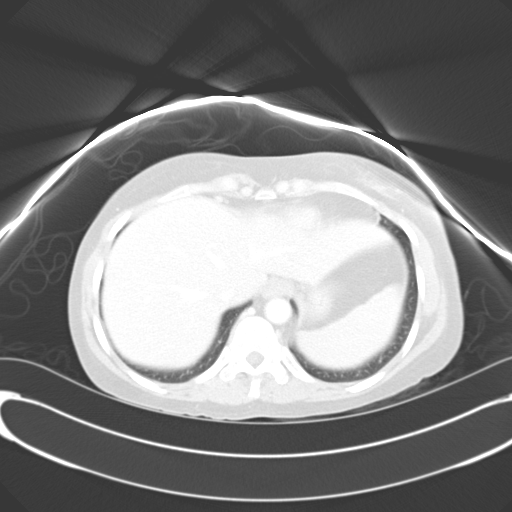
[im 82/89  soft-tissue]
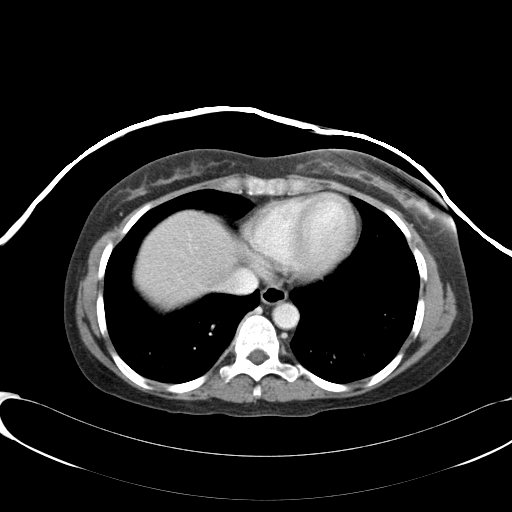
[im 82/89  lung]
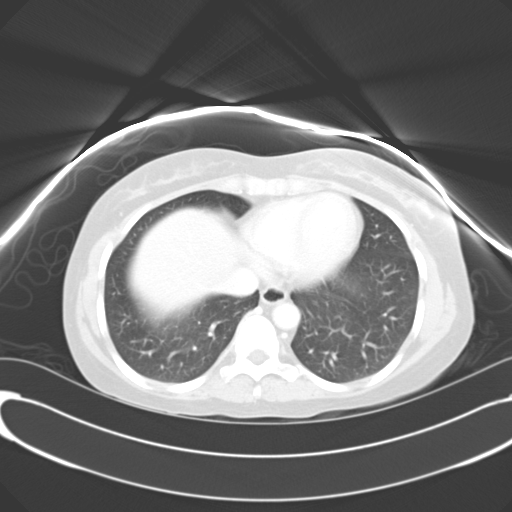
[im 85/89  lung]
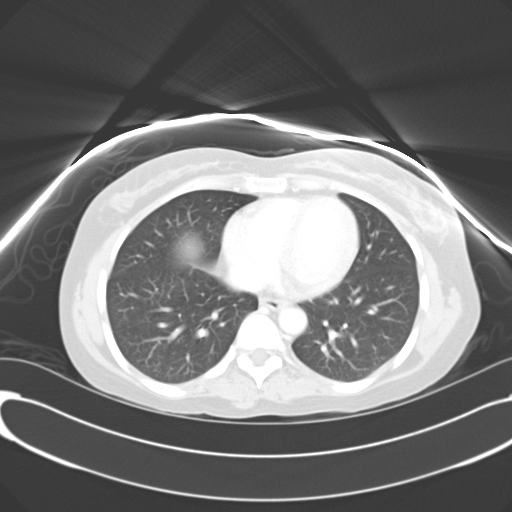

[14 of 32 positions shown; findings below may reference images not displayed]

FINDINGS: The lung bases are clear without focal nodule, mass, or
airspace disease.  The heart size is normal.  No significant
pleural or pericardial effusion is present.

The liver and spleen are unremarkable.  A small splenule is evident
in the hilum.  The stomach, duodenum, and pancreas are within
normal limits.  The common bile duct and gallbladder are normal.
The adrenal glands are normal bilaterally.  The there is mild
dilation of the right greater than left renal collecting system.
The proximal ureters are dilated bilaterally.

A large peripherally enhancing fluid collection with air-fluid
levels is present within the anatomic pelvis, posterior to the
uterus.  There is mass effect on the uterus and bladder and
posterior displacement of the rectosigmoid colon.  The collection
measures 8.5 x 6.7 x 11.6 cm.  There is a second collection
involving the retroperitoneum along the ileocolic ligament.
Several lymph nodes are present along the ileocolic ligament.  No
discrete uterine lesions are seen.  The ovaries are not discretely
visualized.

The rectosigmoid colon is displaced posteriorly.  There appears to
be separate from the air fluid collections.

The transverse proximal descending colon is within normal limits.
The ascending colon is unremarkable.  The appendix is not clearly
identified.  The small bowel is unremarkable.

The bone windows are unremarkable.  Urinary bladder is displaced
anteriorly, but appears to be within normal limits.
IMPRESSION: 1.  Multiloculated fluid collections within the lower abdomen and
pelvis compatible with abscess.  The largest collection is in the
cul-de-sac between the uterus and rectum. This collection measures
11.6 cm. This most likely represents a large tubo-ovarian abscess.
Involvement of the appendix is not excluded.
2.  Some collections extend into the retroperitoneum, along the
ileocolic ligament.
3.  Enlarged ileocolic lymph nodes are likely reactive.
4.  Mild bilateral hydronephrosis secondary to relative obstruction
of the ureters by the abscess collection.

## 2013-04-21 ENCOUNTER — Ambulatory Visit: Payer: 59

## 2013-04-21 ENCOUNTER — Ambulatory Visit (INDEPENDENT_AMBULATORY_CARE_PROVIDER_SITE_OTHER): Payer: 59 | Admitting: Internal Medicine

## 2013-04-21 VITALS — BP 126/78 | HR 97 | Temp 98.3°F | Resp 17 | Ht 64.0 in | Wt 91.0 lb

## 2013-04-21 DIAGNOSIS — R319 Hematuria, unspecified: Secondary | ICD-10-CM

## 2013-04-21 DIAGNOSIS — R1032 Left lower quadrant pain: Secondary | ICD-10-CM

## 2013-04-21 LAB — POCT URINALYSIS DIPSTICK
BILIRUBIN UA: NEGATIVE
Glucose, UA: NEGATIVE
Ketones, UA: 15
Leukocytes, UA: NEGATIVE
NITRITE UA: NEGATIVE
PH UA: 5.5
Spec Grav, UA: >=1.03
UROBILINOGEN UA: 0.2

## 2013-04-21 LAB — POCT CBC
Granulocyte percent: 78.3 %G (ref 37–80)
HCT, POC: 39.9 % (ref 37.7–47.9)
Hemoglobin: 12.5 g/dL (ref 12.2–16.2)
Lymph, poc: 0.8 (ref 0.6–3.4)
MCH, POC: 27 pg (ref 27–31.2)
MCHC: 31.3 g/dL — AB (ref 31.8–35.4)
MCV: 86.2 fL (ref 80–97)
MID (cbc): 0.3 (ref 0–0.9)
MPV: 9.5 fL (ref 0–99.8)
PLATELET COUNT, POC: 178 10*3/uL (ref 142–424)
POC GRANULOCYTE: 4.1 (ref 2–6.9)
POC LYMPH PERCENT: 15.6 %L (ref 10–50)
POC MID %: 6.1 % (ref 0–12)
RBC: 4.63 M/uL (ref 4.04–5.48)
RDW, POC: 13.8 %
WBC: 5.2 10*3/uL (ref 4.6–10.2)

## 2013-04-21 LAB — POCT UA - MICROSCOPIC ONLY
Bacteria, U Microscopic: NEGATIVE
CASTS, UR, LPF, POC: NEGATIVE
Crystals, Ur, HPF, POC: NEGATIVE
Yeast, UA: NEGATIVE

## 2013-04-21 LAB — POCT URINE PREGNANCY: PREG TEST UR: NEGATIVE

## 2013-04-21 MED ORDER — POLYETHYLENE GLYCOL 3350 17 GM/SCOOP PO POWD: 17.0000 g | Freq: Two times a day (BID) | ORAL | Status: AC | PRN

## 2013-04-21 MED ORDER — TRAMADOL HCL 50 MG PO TABS: 50.0000 mg | ORAL_TABLET | Freq: Four times a day (QID) | ORAL | Status: AC | PRN

## 2013-04-21 NOTE — Progress Notes (Addendum)
Subjective:    Patient ID: [name], female    DOB: [birth year], 43 y.o.   MRN: [id number] This chart was scribed for [facility] MD by Danella Maiers, ED Scribe. This patient was seen in room 9 and the patient's care was started at 7:39 PM.  Chief Complaint  Patient presents with  . Headache  . Abdominal Pain  . Back Pain    HPI HPI Comments: [name] is a 43 y.o. female who presents to the [facility] complaining of intermittent LLQ abdominal pain described as pressure onset 4 days ago that worsened today while she was at work. She took ibuprofen with no relief. She reports the pain increases when she moves.  She denies diarrhea, blood in stools, nausea, vomiting, dysuria, cough. Her LMP was January 5th. She is unsure if she could be pregnant. She has one 74 year old boy. Sister is translating/helping to translate She hurts too much to return to work  Status post intra-abdominal surgery a few years ago/laparoscopic appendectomy 2012 No current illnesses No medications No allergies  Review of Systems  Constitutional: Negative for fever and unexpected weight change.   no headaches or vision changes No chest pain or palpitations No coughing or shortness of breath Married sexually active without problems     Objective:   Physical Exam  Nursing note and vitals reviewed. Constitutional: She is oriented to person, place, and time. She appears well-developed and well-nourished. No distress.  HENT:  Head: Normocephalic and atraumatic.  Eyes: EOM are normal.  Neck: Neck supple.  Cardiovascular: Normal rate, regular rhythm and normal heart sounds.   Pulmonary/Chest: Effort normal. No respiratory distress.  Abdominal: Soft. Bowel sounds are normal. She exhibits no distension. There is no hepatosplenomegaly. There is tenderness in the left lower quadrant. There is rebound and guarding (LLQ). There is no rigidity.  Musculoskeletal: Normal range of  motion.  Neurological: She is alert and oriented to person, place, and time.  Skin: Skin is warm and dry.  Psychiatric: She has a normal mood and affect. Her behavior is normal.     Filed Vitals:   04/21/13 1831  BP: 126/78  Pulse: 97  Temp: 98.3 F (36.8 C)  TempSrc: Oral  Resp: 17  Height: 5\' 4"  (1.626 m)  Weight: 91 lb (41.277 kg)  SpO2: 100%   [facility] reading (PRIMARY) by  Dr. Alfonzia Woolum=NAD-lots of stool  UA reveals hematuria/CBC and pregnancy test are both within normal limits     Assessment & Plan:  LLQ pain - Plan:, Comprehensive metabolic panel,  Sedimentation rate  Hematuria - Plan: CT urogram    I have completed the patient encounter in its entirety as documented by the scribe, with editing by me where necessary. Jarell Mcewen P. Merla Riches, M.D.   Sister's cell [phone]  Adden: Results for orders placed in visit on 04/21/13  COMPREHENSIVE METABOLIC PANEL      Result Value Ref Range   Sodium 135  135 - 145 mEq/L   Potassium 3.9  3.5 - 5.3 mEq/L   Chloride 103  96 - 112 mEq/L   CO2 22  19 - 32 mEq/L   Glucose, Bld 79  70 - 99 mg/dL   BUN 9  6 - 23 mg/dL   Creat 8.29  5.62 - 1.30 mg/dL   Total Bilirubin 0.8  0.2 - 1.2 mg/dL   Alkaline Phosphatase 33 (*) 39 - 117 U/L   AST 25  0 - 37 U/L   ALT  23  0 - 35 U/L   Total Protein 7.3  6.0 - 8.3 g/dL   Albumin 4.3  3.5 - 5.2 g/dL   Calcium 8.9  8.4 - 16.110.5 mg/dL  SEDIMENTATION RATE      Result Value Ref Range   Sed Rate 11  0 - 22 mm/hr  POCT CBC      Result Value Ref Range   WBC 5.2  4.6 - 10.2 K/uL   Lymph, poc 0.8  0.6 - 3.4   POC LYMPH PERCENT 15.6  10 - 50 %L   MID (cbc) 0.3  0 - 0.9   POC MID % 6.1  0 - 12 %M   POC Granulocyte 4.1  2 - 6.9   Granulocyte percent 78.3  37 - 80 %G   RBC 4.63  4.04 - 5.48 M/uL   Hemoglobin 12.5  12.2 - 16.2 g/dL   HCT, POC 09.639.9  04.537.7 - 47.9 %   MCV 86.2  80 - 97 fL   MCH, POC 27.0  27 - 31.2 pg   MCHC 31.3 (*) 31.8 - 35.4 g/dL   RDW, POC 40.913.8     Platelet Count, POC  178  142 - 424 K/uL   MPV 9.5  0 - 99.8 fL  POCT URINALYSIS DIPSTICK      Result Value Ref Range   Color, UA yellow     Clarity, UA clear     Glucose, UA neg     Bilirubin, UA neg     Ketones, UA 15     Spec Grav, UA >=1.030     Blood, UA moderate     pH, UA 5.5     Protein, UA trace     Urobilinogen, UA 0.2     Nitrite, UA neg     Leukocytes, UA Negative    POCT UA - MICROSCOPIC ONLY      Result Value Ref Range   WBC, Ur, HPF, POC 0-1     RBC, urine, microscopic 8-14     Bacteria, U Microscopic neg     Mucus, UA trace     Epithelial cells, urine per micros 0-1     Crystals, Ur, HPF, POC neg     Casts, Ur, LPF, POC neg     Yeast, UA neg    POCT URINE PREGNANCY      Result Value Ref Range   Preg Test, Ur Negative     CT of abdomen and pelvis pending

## 2013-04-22 LAB — COMPREHENSIVE METABOLIC PANEL
ALBUMIN: 4.3 g/dL (ref 3.5–5.2)
ALT: 23 U/L (ref 0–35)
AST: 25 U/L (ref 0–37)
Alkaline Phosphatase: 33 U/L — ABNORMAL LOW (ref 39–117)
BUN: 9 mg/dL (ref 6–23)
CALCIUM: 8.9 mg/dL (ref 8.4–10.5)
CHLORIDE: 103 meq/L (ref 96–112)
CO2: 22 mEq/L (ref 19–32)
Creat: 0.82 mg/dL (ref 0.50–1.10)
Glucose, Bld: 79 mg/dL (ref 70–99)
Potassium: 3.9 mEq/L (ref 3.5–5.3)
Sodium: 135 mEq/L (ref 135–145)
Total Bilirubin: 0.8 mg/dL (ref 0.2–1.2)
Total Protein: 7.3 g/dL (ref 6.0–8.3)

## 2013-04-22 LAB — SEDIMENTATION RATE: Sed Rate: 11 mm/hr (ref 0–22)

## 2014-12-29 ENCOUNTER — Other Ambulatory Visit: Payer: Self-pay

## 2014-12-29 DIAGNOSIS — Z1231 Encounter for screening mammogram for malignant neoplasm of breast: Secondary | ICD-10-CM

## 2015-01-07 ENCOUNTER — Ambulatory Visit: Payer: Medicaid Other

## 2015-05-05 ENCOUNTER — Other Ambulatory Visit: Payer: Self-pay | Admitting: Infectious Disease

## 2015-05-05 ENCOUNTER — Ambulatory Visit
Admission: RE | Admit: 2015-05-05 | Discharge: 2015-05-05 | Disposition: A | Discharge status: Home / Self Care | Payer: No Typology Code available for payment source | Source: Ambulatory Visit | Attending: Infectious Disease | Admitting: Infectious Disease

## 2015-05-05 DIAGNOSIS — Z111 Encounter for screening for respiratory tuberculosis: Secondary | ICD-10-CM

## 2019-04-24 ENCOUNTER — Other Ambulatory Visit: Payer: Self-pay

## 2019-04-24 DIAGNOSIS — N6310 Unspecified lump in the right breast, unspecified quadrant: Secondary | ICD-10-CM

## 2019-04-24 DIAGNOSIS — N644 Mastodynia: Secondary | ICD-10-CM

## 2019-05-12 ENCOUNTER — Other Ambulatory Visit: Payer: Self-pay

## 2019-05-12 ENCOUNTER — Ambulatory Visit: Payer: Self-pay

## 2019-08-29 ENCOUNTER — Emergency Department (HOSPITAL_COMMUNITY): Payer: 59

## 2019-08-29 ENCOUNTER — Emergency Department (HOSPITAL_COMMUNITY)
Admission: EM | Admit: 2019-08-29 | Discharge: 2019-08-30 | Disposition: A | Discharge status: Home / Self Care | Payer: 59 | Attending: Emergency Medicine | Admitting: Emergency Medicine

## 2019-08-29 ENCOUNTER — Other Ambulatory Visit: Payer: Self-pay

## 2019-08-29 DIAGNOSIS — R69 Illness, unspecified: Secondary | ICD-10-CM | POA: Diagnosis present

## 2019-08-29 DIAGNOSIS — B9689 Other specified bacterial agents as the cause of diseases classified elsewhere: Secondary | ICD-10-CM | POA: Diagnosis not present

## 2019-08-29 DIAGNOSIS — E162 Hypoglycemia, unspecified: Secondary | ICD-10-CM | POA: Insufficient documentation

## 2019-08-29 DIAGNOSIS — N3001 Acute cystitis with hematuria: Secondary | ICD-10-CM | POA: Diagnosis not present

## 2019-08-29 DIAGNOSIS — R5383 Other fatigue: Secondary | ICD-10-CM | POA: Diagnosis not present

## 2019-08-29 LAB — BASIC METABOLIC PANEL
Anion gap: 9 (ref 5–15)
BUN: 17 mg/dL (ref 6–20)
CO2: 21 mmol/L — ABNORMAL LOW (ref 22–32)
Calcium: 8.3 mg/dL — ABNORMAL LOW (ref 8.9–10.3)
Chloride: 106 mmol/L (ref 98–111)
Creatinine, Ser: 0.87 mg/dL (ref 0.44–1.00)
GFR calc Af Amer: >60 mL/min (ref >60)
GFR calc non Af Amer: >60 mL/min (ref >60)
Glucose, Bld: 79 mg/dL (ref 70–99)
Potassium: 4.1 mmol/L (ref 3.5–5.1)
Sodium: 136 mmol/L (ref 135–145)

## 2019-08-29 LAB — CBC WITH DIFFERENTIAL/PLATELET
Abs Immature Granulocytes: 0.01 10*3/uL (ref 0.00–0.07)
Basophils Absolute: 0 10*3/uL (ref 0.0–0.1)
Basophils Relative: 1 %
Eosinophils Absolute: 0 10*3/uL (ref 0.0–0.5)
Eosinophils Relative: 0 %
HCT: 32.1 % — ABNORMAL LOW (ref 36.0–46.0)
Hemoglobin: 10.9 g/dL — ABNORMAL LOW (ref 12.0–15.0)
Immature Granulocytes: 0 %
Lymphocytes Relative: 26 %
Lymphs Abs: 1.4 10*3/uL (ref 0.7–4.0)
MCH: 27.5 pg (ref 26.0–34.0)
MCHC: 34 g/dL (ref 30.0–36.0)
MCV: 80.9 fL (ref 80.0–100.0)
Monocytes Absolute: 0.4 10*3/uL (ref 0.1–1.0)
Monocytes Relative: 8 %
Neutro Abs: 3.4 10*3/uL (ref 1.7–7.7)
Neutrophils Relative %: 65 %
Platelets: 182 10*3/uL (ref 150–400)
RBC: 3.97 MIL/uL (ref 3.87–5.11)
RDW: 13.8 % (ref 11.5–15.5)
WBC: 5.2 10*3/uL (ref 4.0–10.5)
nRBC: 0 % (ref 0.0–0.2)

## 2019-08-29 LAB — PREGNANCY, URINE: Preg Test, Ur: NEGATIVE

## 2019-08-29 LAB — CBG MONITORING, ED
Glucose-Capillary: 103 mg/dL — ABNORMAL HIGH (ref 70–99)
Glucose-Capillary: 63 mg/dL — ABNORMAL LOW (ref 70–99)
Glucose-Capillary: 81 mg/dL (ref 70–99)

## 2019-08-29 MED ORDER — SODIUM CHLORIDE 0.9 % IV BOLUS (SEPSIS)
1000.0000 mL | Freq: Once | INTRAVENOUS | Status: AC
  Administered 2019-08-29: 1000 mL via INTRAVENOUS

## 2019-08-29 NOTE — ED Triage Notes (Signed)
Patient bib gems, c/o hypoglycemia/hypotension x2 days, CBG w/ ems was 65. Patient reports she was unable to eat for two days because she didn't feel well. Patients CBG came up to 109. Patient received ns en route. Initial bp w/ ems was 86 palpated but now up to 112/76, o2 99% RA, temp 99.9, IV access in left AC 20G.

## 2019-08-29 NOTE — Discharge Instructions (Addendum)
You were seen today because you were feeling unwell. Your chest xray and blood work was reassuring. It does appear that you have a urinary tract infection so we are treating you with antibiotics. Your blood sugar was initially low but got better with eating and drinking juice. Please follow up with your primary care provider for further evaluation of your symptoms and be sure to eat regularly throughout the day. Thank you for allowing me to care for you today. Please return to the emergency department if you have new or worsening symptoms. Take your medications as instructed.

## 2019-08-29 NOTE — ED Notes (Signed)
Pt given a ham sandwich and 8oz orange juice after taking CBG.

## 2019-08-29 NOTE — ED Provider Notes (Signed)
[facility]-EMERGENCY DEPT Provider Note   CSN: [id number] Arrival date & time: 08/29/19  1444     History Chief Complaint  Patient presents with  . Hypoglycemia  . Hypotension    [name] is a 49 y.o. female.  Patient is a 49 year old female who reports that she has a past medical history of hypercholesterolemia and hypoglycemia presented to the emergency department for feeling generally unwell.  Patient reports that since yesterday she has felt sick overall.  She reports that she had urinary urgency and decreased appetite and poor p.o. intake.  She also reports that she felt numbness on the inside of her right cheek.  She denies any focal weakness, syncope, nausea, vomiting, diarrhea.  She takes medication only for elevated cholesterol and nothing else.        No past medical history on file.  There are no problems to display for this patient.      OB History   No obstetric history on file.     No family history on file.  Social History   Tobacco Use  . Smoking status: Never Smoker  Substance Use Topics  . Alcohol use: Not on file  . Drug use: Not on file    Home Medications Prior to Admission medications   Medication Sig Start Date End Date Taking? Authorizing Provider  levothyroxine (SYNTHROID) 25 MCG tablet Take 1 tablet by mouth 2 (two) times daily. 08/05/19  Yes [provider]  cephALEXin (KEFLEX) 500 MG capsule Take 1 capsule (500 mg total) by mouth 2 (two) times daily for 7 days. 08/30/19 09/06/19  Ronnie Doss A, PA-C  polyethylene glycol powder (GLYCOLAX/MIRALAX) powder Take 17 g by mouth 2 (two) times daily as needed. Until stools are soft 04/21/13   Tonye Pearson, MD  traMADol (ULTRAM) 50 MG tablet Take 1 tablet (50 mg total) by mouth every 6 (six) hours as needed. 04/21/13   Tonye Pearson, MD    Allergies    Patient has no known allergies.  Review of Systems   Review of Systems    Constitutional: Positive for appetite change, fatigue and fever (subjective). Negative for chills.  HENT: Negative for congestion and sore throat.   Eyes: Negative for photophobia and visual disturbance.  Respiratory: Negative for cough and shortness of breath.   Cardiovascular: Negative for chest pain.  Gastrointestinal: Negative for abdominal pain, anal bleeding, nausea and vomiting.  Genitourinary: Positive for urgency. Negative for decreased urine volume, difficulty urinating and dysuria.  Musculoskeletal: Negative for back pain.  Skin: Negative for rash.  Neurological: Positive for dizziness and numbness. Negative for tremors, seizures, syncope, facial asymmetry, speech difficulty, light-headedness and headaches.  Psychiatric/Behavioral: Negative for confusion.  All other systems reviewed and are negative.   Physical Exam Updated Vital Signs BP 111/87   Pulse 88   Temp 98 F (36.7 C) (Oral)   Resp 16   SpO2 100%   Physical Exam Vitals and nursing note reviewed.  Constitutional:      General: She is not in acute distress.    Appearance: Normal appearance. She is not ill-appearing, toxic-appearing or diaphoretic.     Comments: Appears older than appears older than stated age  HENT:     Head: Normocephalic and atraumatic.     Right Ear: Tympanic membrane normal.     Left Ear: Tympanic membrane normal.     Nose: Nose normal.     Mouth/Throat:     Mouth: Mucous membranes are moist.  Eyes:     Extraocular Movements: Extraocular movements intact.     Conjunctiva/sclera: Conjunctivae normal.     Pupils: Pupils are equal, round, and reactive to light.  Cardiovascular:     Rate and Rhythm: Normal rate and regular rhythm.     Pulses: Normal pulses.  Pulmonary:     Effort: Pulmonary effort is normal.     Breath sounds: Normal breath sounds.  Abdominal:     General: Abdomen is flat. Bowel sounds are normal.     Palpations: Abdomen is soft.     Tenderness: There is no  abdominal tenderness.  Musculoskeletal:     Right lower leg: No edema.     Left lower leg: No edema.  Skin:    General: Skin is warm and dry.  Neurological:     General: No focal deficit present.     Mental Status: She is alert and oriented to person, place, and time.     Cranial Nerves: No cranial nerve deficit.     Sensory: No sensory deficit.     Motor: No weakness.     Coordination: Coordination normal.     Gait: Gait normal.     Deep Tendon Reflexes: Reflexes normal.     Comments: Able to feel tongue and insides of mouth without defitcit  Psychiatric:        Mood and Affect: Mood normal.     ED Results / Procedures / Treatments   Labs (all labs ordered are listed, but only abnormal results are displayed) Labs Reviewed  BASIC METABOLIC PANEL - Abnormal; Notable for the following components:      Result Value   CO2 21 (*)    Calcium 8.3 (*)    All other components within normal limits  CBC WITH DIFFERENTIAL/PLATELET - Abnormal; Notable for the following components:   Hemoglobin 10.9 (*)    HCT 32.1 (*)    All other components within normal limits  URINALYSIS, ROUTINE W REFLEX MICROSCOPIC - Abnormal; Notable for the following components:   Hgb urine dipstick LARGE (*)    Ketones, ur 20 (*)    Protein, ur 30 (*)    Leukocytes,Ua MODERATE (*)    Bacteria, UA RARE (*)    All other components within normal limits  CBG MONITORING, ED - Abnormal; Notable for the following components:   Glucose-Capillary 63 (*)    All other components within normal limits  CBG MONITORING, ED - Abnormal; Notable for the following components:   Glucose-Capillary 103 (*)    All other components within normal limits  URINE CULTURE  PREGNANCY, URINE  CBG MONITORING, ED    EKG None  Radiology DG Chest 2 View  Result Date: 08/29/2019 CLINICAL DATA:  Hypoglycemia and hypotension x2 days. EXAM: CHEST - 2 VIEW COMPARISON:  May 05, 2015 FINDINGS: The heart size and mediastinal contours  are within normal limits. Both lungs are clear. The visualized skeletal structures are unremarkable. IMPRESSION: No active cardiopulmonary disease. Electronically Signed   By: Virgina Norfolk M.D.   On: 08/29/2019 21:44    Procedures Procedures (including critical care time)  Medications Ordered in ED Medications  cephALEXin (KEFLEX) capsule 250 mg (has no administration in time range)  sodium chloride 0.9 % bolus 1,000 mL (0 mLs Intravenous Stopped 08/29/19 2238)    ED Course  I have reviewed the triage vital signs and the nursing notes.  Pertinent labs & imaging results that were available during my care of the patient were reviewed by  me and considered in my medical decision making (see chart for details).  Clinical Course as of Aug 29 13  Sat Aug 29, 2019  2127 Patient presenting for generally feeling unwell with poor PO intake since yesterday. She has hx of hypoglycemia per her report, not diabetic. Glucose in the los 60s. She is currently tolerating PO just fine and vitals are normal. She also reportedly had BP in the 80s systollically per EMS which improved with fluids. Given food, IV fluids and awaiting infectious workup at this time. She has had her covid vaccines.    [KM]  2309 Workup is overall reassuring. She did have a glucose of 63 but this improved to 103 with eating. She was not orthostatic and her remaining labs were unremarkable. She had a normal chest xray. She did have some signs of possible uti with bacteria, hgb, wbc, rbc and leukocytes. Given her urinary urgency we will treat for UTI with po keflex. She has a PMD who she says she will f/u with in 2 days. Advised on return precautions   [KM]    Clinical Course User Index [KM] Jeral Pinch   MDM Rules/Calculators/A&P                          Based on review of vitals, medical screening exam, lab work and/or imaging, there does not appear to be an acute, emergent etiology for the patient's symptoms.  Counseled pt on good return precautions and encouraged both PCP and ED follow-up as needed.  Prior to discharge, I also discussed incidental imaging findings with patient in detail and advised appropriate, recommended follow-up in detail.  Clinical Impression: 1. Acute cystitis with hematuria   2. Hypoglycemia   3. Fatigue, unspecified type     Disposition: Discharge  Prior to providing a prescription for a controlled substance, I independently reviewed the patient's recent prescription history on the [place] Controlled Substance Reporting System. The patient had no recent or regular prescriptions and was deemed appropriate for a brief, less than 3 day prescription of narcotic for acute analgesia.  This note was prepared with assistance of Conservation officer, historic buildings. Occasional wrong-word or sound-a-like substitutions may have occurred due to the inherent limitations of voice recognition software.  Final Clinical Impression(s) / ED Diagnoses Final diagnoses:  Hypoglycemia  Fatigue, unspecified type  Acute cystitis with hematuria    Rx / DC Orders ED Discharge Orders         Ordered    cephALEXin (KEFLEX) 500 MG capsule  2 times daily     Discontinue  Reprint     08/30/19 0015           Arlyn Dunning, PA-C 08/30/19 0015    Pricilla Loveless, MD 08/30/19 [phone]

## 2019-08-30 ENCOUNTER — Encounter (HOSPITAL_COMMUNITY): Payer: Self-pay | Admitting: Emergency Medicine

## 2019-08-30 LAB — URINALYSIS, ROUTINE W REFLEX MICROSCOPIC
Bilirubin Urine: NEGATIVE
Glucose, UA: NEGATIVE mg/dL
Ketones, ur: 20 mg/dL — AB
Nitrite: NEGATIVE
Protein, ur: 30 mg/dL — AB
Specific Gravity, Urine: 1.021 (ref 1.005–1.030)
pH: 5 (ref 5.0–8.0)

## 2019-08-30 MED ORDER — CEPHALEXIN 250 MG PO CAPS
250.0000 mg | ORAL_CAPSULE | Freq: Once | ORAL | Status: AC
  Administered 2019-08-30: 250 mg via ORAL
  Filled 2019-08-30: qty 1

## 2019-08-30 MED ORDER — CEPHALEXIN 500 MG PO CAPS
500.0000 mg | ORAL_CAPSULE | Freq: Two times a day (BID) | ORAL | 0 refills | Status: AC
Start: 2019-08-30 — End: 2019-09-06

## 2019-08-31 LAB — URINE CULTURE: Culture: <10000 — AB
